# Patient Record
Sex: Female | Born: 1955 | Race: White | Hispanic: No | Marital: Single | Smoking: Never smoker
Health system: Southern US, Community
[De-identification: ages and names within clinical notes are randomized; demographics above are authoritative.]

## PROBLEM LIST (undated history)

## (undated) DIAGNOSIS — Z923 Personal history of irradiation: Secondary | ICD-10-CM

## (undated) DIAGNOSIS — Z853 Personal history of malignant neoplasm of breast: Secondary | ICD-10-CM

## (undated) DIAGNOSIS — Z9221 Personal history of antineoplastic chemotherapy: Secondary | ICD-10-CM

## (undated) DIAGNOSIS — C50919 Malignant neoplasm of unspecified site of unspecified female breast: Secondary | ICD-10-CM

## (undated) HISTORY — DX: Personal history of malignant neoplasm of breast: Z85.3

## (undated) HISTORY — PX: COLONOSCOPY: SHX174

## (undated) HISTORY — DX: Malignant neoplasm of unspecified site of unspecified female breast: C50.919

## (undated) HISTORY — PX: BREAST EXCISIONAL BIOPSY: SUR124

---

## 1995-05-18 DIAGNOSIS — C50919 Malignant neoplasm of unspecified site of unspecified female breast: Secondary | ICD-10-CM

## 1995-05-18 DIAGNOSIS — Z853 Personal history of malignant neoplasm of breast: Secondary | ICD-10-CM

## 1995-05-18 HISTORY — DX: Personal history of malignant neoplasm of breast: Z85.3

## 1995-05-18 HISTORY — PX: BREAST LUMPECTOMY: SHX2

## 1995-05-18 HISTORY — DX: Malignant neoplasm of unspecified site of unspecified female breast: C50.919

## 1998-06-19 ENCOUNTER — Other Ambulatory Visit: Admission: RE | Admit: 1998-06-19 | Discharge: 1998-06-19 | Payer: Self-pay | Admitting: Obstetrics and Gynecology

## 1999-04-30 ENCOUNTER — Encounter: Admission: RE | Admit: 1999-04-30 | Discharge: 1999-06-30 | Payer: Self-pay | Admitting: Hematology & Oncology

## 2000-02-04 ENCOUNTER — Other Ambulatory Visit: Admission: RE | Admit: 2000-02-04 | Discharge: 2000-02-04 | Payer: Self-pay | Admitting: Obstetrics and Gynecology

## 2000-07-19 ENCOUNTER — Other Ambulatory Visit: Admission: RE | Admit: 2000-07-19 | Discharge: 2000-07-19 | Payer: Self-pay | Admitting: Obstetrics and Gynecology

## 2000-07-19 ENCOUNTER — Encounter (INDEPENDENT_AMBULATORY_CARE_PROVIDER_SITE_OTHER): Payer: Self-pay | Admitting: Specialist

## 2000-08-30 ENCOUNTER — Encounter: Payer: Self-pay | Admitting: Obstetrics and Gynecology

## 2000-08-30 ENCOUNTER — Encounter: Admission: RE | Admit: 2000-08-30 | Discharge: 2000-08-30 | Payer: Self-pay | Admitting: Internal Medicine

## 2001-08-31 ENCOUNTER — Encounter: Payer: Self-pay | Admitting: Obstetrics and Gynecology

## 2001-08-31 ENCOUNTER — Encounter: Admission: RE | Admit: 2001-08-31 | Discharge: 2001-08-31 | Payer: Self-pay | Admitting: Obstetrics and Gynecology

## 2003-01-08 ENCOUNTER — Encounter: Payer: Self-pay | Admitting: Obstetrics and Gynecology

## 2003-01-08 ENCOUNTER — Encounter: Admission: RE | Admit: 2003-01-08 | Discharge: 2003-01-08 | Payer: Self-pay | Admitting: Obstetrics and Gynecology

## 2004-09-29 ENCOUNTER — Encounter: Admission: RE | Admit: 2004-09-29 | Discharge: 2004-09-29 | Payer: Self-pay | Admitting: Obstetrics and Gynecology

## 2005-10-19 ENCOUNTER — Encounter: Admission: RE | Admit: 2005-10-19 | Discharge: 2005-10-19 | Payer: Self-pay | Admitting: Obstetrics and Gynecology

## 2007-03-08 ENCOUNTER — Encounter: Admission: RE | Admit: 2007-03-08 | Discharge: 2007-03-08 | Payer: Self-pay | Admitting: Obstetrics and Gynecology

## 2010-06-07 ENCOUNTER — Encounter: Payer: Self-pay | Admitting: Obstetrics and Gynecology

## 2011-11-15 ENCOUNTER — Other Ambulatory Visit: Payer: Self-pay | Admitting: Obstetrics and Gynecology

## 2011-11-15 DIAGNOSIS — Z1231 Encounter for screening mammogram for malignant neoplasm of breast: Secondary | ICD-10-CM

## 2011-11-26 ENCOUNTER — Ambulatory Visit
Admission: RE | Admit: 2011-11-26 | Discharge: 2011-11-26 | Disposition: A | Discharge status: Home / Self Care | Payer: BC Managed Care – PPO | Source: Ambulatory Visit | Attending: Obstetrics and Gynecology | Admitting: Obstetrics and Gynecology

## 2011-11-26 DIAGNOSIS — Z1231 Encounter for screening mammogram for malignant neoplasm of breast: Secondary | ICD-10-CM

## 2012-08-11 ENCOUNTER — Encounter: Payer: Self-pay | Admitting: Internal Medicine

## 2012-09-26 ENCOUNTER — Ambulatory Visit (AMBULATORY_SURGERY_CENTER): Payer: BC Managed Care – PPO | Admitting: *Deleted

## 2012-09-26 ENCOUNTER — Encounter: Payer: Self-pay | Admitting: Internal Medicine

## 2012-09-26 VITALS — Ht 67.0 in | Wt 127.6 lb

## 2012-09-26 DIAGNOSIS — Z1211 Encounter for screening for malignant neoplasm of colon: Secondary | ICD-10-CM

## 2012-09-26 MED ORDER — MOVIPREP 100 G PO SOLR: ORAL | Status: DC

## 2012-10-10 ENCOUNTER — Ambulatory Visit (AMBULATORY_SURGERY_CENTER): Payer: BC Managed Care – PPO | Admitting: Internal Medicine

## 2012-10-10 ENCOUNTER — Encounter: Payer: Self-pay | Admitting: Internal Medicine

## 2012-10-10 VITALS — BP 150/80 | HR 68 | Temp 98.6°F | Resp 21 | Ht 67.0 in | Wt 127.0 lb

## 2012-10-10 DIAGNOSIS — D126 Benign neoplasm of colon, unspecified: Secondary | ICD-10-CM

## 2012-10-10 DIAGNOSIS — Z1211 Encounter for screening for malignant neoplasm of colon: Secondary | ICD-10-CM

## 2012-10-10 MED ORDER — SODIUM CHLORIDE 0.9 % IV SOLN: 500.0000 mL | INTRAVENOUS | Status: DC

## 2012-10-10 NOTE — Progress Notes (Signed)
Patient did not experience any of the following events: a burn prior to discharge; a fall within the facility; wrong site/side/patient/procedure/implant event; or a hospital transfer or hospital admission upon discharge from the facility. (G8907) Patient did not have preoperative order for IV antibiotic SSI prophylaxis. (G8918)  

## 2012-10-10 NOTE — Progress Notes (Signed)
No egg or soy allergy. ewm 

## 2012-10-10 NOTE — Patient Instructions (Addendum)

## 2012-10-10 NOTE — Op Note (Signed)
[facility] [address] [place], [address]   COLONOSCOPY PROCEDURE REPORT  PATIENT: Kayla, Ortega  MR#: [id number] BIRTHDATE: 1956/04/18 , 57  yrs. old GENDER: Female ENDOSCOPIST: Roxy Cedar, MD REFERRED NG:EXBMWU Ambrose Mantle, M.D. PROCEDURE DATE:  10/10/2012 PROCEDURE:   Colonoscopy with snare polypectomy    x 1 ASA CLASS:   Class II INDICATIONS:average risk screening. MEDICATIONS: MAC sedation, administered by CRNA and propofol (Diprivan) 300mg  IV  DESCRIPTION OF PROCEDURE:   After the risks benefits and alternatives of the procedure were thoroughly explained, informed consent was obtained.  A digital rectal exam revealed no abnormalities of the rectum.   The LB [id number]  endoscope was introduced through the anus and advanced to the cecum, which was identified by both the appendix and ileocecal valve. No adverse events experienced.   The quality of the prep was excellent, using MoviPrep  The instrument was then slowly withdrawn as the colon was fully examined.      COLON FINDINGS: A diminutive polyp was found in the descending colon.  A polypectomy was performed with a cold snare.  The resection was complete and the polyp tissue was completely retrieved.   The colon was otherwise normal.Retroflexion in the right colon was performed.  There was no diverticulosis, inflammation, other polyps or cancers .  Retroflexed views revealed no abnormalities. The time to cecum=4 minutes 26 seconds. Withdrawal time=11 minutes 20 seconds.  The scope was withdrawn and the procedure completed. COMPLICATIONS: There were no complications.  ENDOSCOPIC IMPRESSION: 1.   Diminutive polyp was found in the descending colon; polypectomy was performed with a cold snare 2.   The colon was otherwise normal  RECOMMENDATIONS: 1. Repeat colonoscopy in 5 years if polyp adenomatous; otherwise 10 years   eSigned:  Roxy Cedar, MD 10/10/2012 8:44 AM   cc:  Tracey Harries, MD and The Patient   PATIENT NAME:  Kayla, Ortega MR#: [id number]

## 2012-10-10 NOTE — Progress Notes (Signed)
Called to room to assist during endoscopic procedure.  Patient ID and intended procedure confirmed with present staff. Received instructions for my participation in the procedure from the performing physician.  

## 2012-10-11 ENCOUNTER — Telehealth: Payer: Self-pay

## 2012-10-11 NOTE — Telephone Encounter (Signed)
At patient request do not leave message. 

## 2012-10-16 ENCOUNTER — Encounter: Payer: Self-pay | Admitting: Internal Medicine

## 2013-07-16 ENCOUNTER — Other Ambulatory Visit: Payer: Self-pay

## 2013-07-16 DIAGNOSIS — Z1231 Encounter for screening mammogram for malignant neoplasm of breast: Secondary | ICD-10-CM

## 2013-07-25 ENCOUNTER — Ambulatory Visit
Admission: RE | Admit: 2013-07-25 | Discharge: 2013-07-25 | Disposition: A | Discharge status: Home / Self Care | Payer: BC Managed Care – PPO | Source: Ambulatory Visit

## 2013-07-25 DIAGNOSIS — Z1231 Encounter for screening mammogram for malignant neoplasm of breast: Secondary | ICD-10-CM

## 2013-10-11 LAB — HM PAP SMEAR: HM PAP: NEGATIVE

## 2014-08-19 ENCOUNTER — Other Ambulatory Visit: Payer: Self-pay

## 2014-08-19 DIAGNOSIS — Z1231 Encounter for screening mammogram for malignant neoplasm of breast: Secondary | ICD-10-CM

## 2014-08-29 ENCOUNTER — Ambulatory Visit
Admission: RE | Admit: 2014-08-29 | Discharge: 2014-08-29 | Disposition: A | Discharge status: Home / Self Care | Payer: BLUE CROSS/BLUE SHIELD | Source: Ambulatory Visit

## 2014-08-29 DIAGNOSIS — Z1231 Encounter for screening mammogram for malignant neoplasm of breast: Secondary | ICD-10-CM

## 2015-06-18 ENCOUNTER — Other Ambulatory Visit: Payer: Self-pay

## 2015-06-18 DIAGNOSIS — Z1231 Encounter for screening mammogram for malignant neoplasm of breast: Secondary | ICD-10-CM

## 2015-08-07 ENCOUNTER — Ambulatory Visit (INDEPENDENT_AMBULATORY_CARE_PROVIDER_SITE_OTHER): Payer: BLUE CROSS/BLUE SHIELD | Admitting: Internal Medicine

## 2015-08-07 ENCOUNTER — Encounter: Payer: Self-pay | Admitting: Internal Medicine

## 2015-08-07 VITALS — BP 118/70 | HR 70 | Temp 98.7°F | Ht 64.5 in | Wt 134.0 lb

## 2015-08-07 DIAGNOSIS — K5900 Constipation, unspecified: Secondary | ICD-10-CM | POA: Diagnosis not present

## 2015-08-07 DIAGNOSIS — Z0000 Encounter for general adult medical examination without abnormal findings: Secondary | ICD-10-CM | POA: Diagnosis not present

## 2015-08-07 DIAGNOSIS — E2839 Other primary ovarian failure: Secondary | ICD-10-CM

## 2015-08-07 NOTE — Progress Notes (Signed)
Pre visit review using our clinic review tool, if applicable. No additional management support is needed unless otherwise documented below in the visit note. 

## 2015-08-07 NOTE — Progress Notes (Signed)
HPI  Pt presents to the clinic today to establish care. She has not had a PCP in many years but does follow with a GYN.  Flu: never Tetanus: unsure Pap Smear: 08/2014 at [facility]: 08/2014 Colon Screening: 2014, every 5 years Vision Screening: as needed Dentist: biannually  Diet: She does eat meat. She eats fruits and veggies daily. She does consume some fried foods. She drinks coffee during the day. Exercise: She does a lot of walking at work, none outside of work.  Past Medical History  Diagnosis Date  . Breast cancer ([place]) 1997    right breast lumpectomy  . History of breast cancer 1997    left    No current outpatient prescriptions on file.   No current facility-administered medications for this visit.    No Known Allergies  Family History  Problem Relation Age of Onset  . Colon cancer Neg Hx   . Alzheimer's disease Father     Social History   Social History  . Marital Status: Single    Spouse Name: N/A  . Number of Children: N/A  . Years of Education: N/A   Occupational History  . Not on file.   Social History Main Topics  . Smoking status: Never Smoker   . Smokeless tobacco: Never Used  . Alcohol Use: 4.2 oz/week    7 Glasses of wine per week  . Drug Use: No  . Sexual Activity: Not on file   Other Topics Concern  . Not on file   Social History Narrative    ROS:  Constitutional: Denies fever, malaise, fatigue, headache or abrupt weight changes.  HEENT: Pt reports decreased hearing (wears hearing aids) Denies eye pain, eye redness, ear pain, ringing in the ears, wax buildup, runny nose, nasal congestion, bloody nose, or sore throat. Respiratory: Denies difficulty breathing, shortness of breath, cough or sputum production.   Cardiovascular: Denies chest pain, chest tightness, palpitations or swelling in the hands or feet.  Gastrointestinal: Pt reports constipation. Denies abdominal pain, bloating, diarrhea or blood in the stool.  GU:  Denies frequency, urgency, pain with urination, blood in urine, odor or discharge. Musculoskeletal: Denies decrease in range of motion, difficulty with gait, muscle pain or joint pain and swelling.  Skin: Denies redness, rashes, lesions or ulcercations.  Neurological: Denies dizziness, difficulty with memory, difficulty with speech or problems with balance and coordination.  Psych: Denies anxiety, depression, SI/HI.  No other specific complaints in a complete review of systems (except as listed in HPI above).  PE:  BP 118/70 mmHg  Pulse 70  Temp(Src) 98.7 F (37.1 C) (Oral)  Ht 5' 4.5" (1.638 m)  Wt 134 lb (60.782 kg)  BMI 22.65 kg/m2  SpO2 98% Wt Readings from Last 3 Encounters:  08/07/15 134 lb (60.782 kg)  10/10/12 127 lb (57.607 kg)  09/26/12 127 lb 9.6 oz (57.879 kg)    General: Appears her stated age, well developed, well nourished in NAD. HEENT: Head: normal shape and size; Eyes: sclera white, no icterus, conjunctiva pink, PERRLA and EOMs intact; Ears: Tm's gray and intact, normal light reflex;mThroat/Mouth: Teeth present, mucosa pink and moist, no lesions or ulcerations noted.  Neck: Neck supple, trachea midline. No masses, lumps or thyromegaly present.  Cardiovascular: Normal rate and rhythm. S1,S2 noted.  No murmur, rubs or gallops noted. No JVD or BLE edema. No carotid bruits noted. Pulmonary/Chest: Normal effort and positive vesicular breath sounds. No respiratory distress. No wheezes, rales or ronchi noted.  Abdomen: Soft  and nontender. Normal bowel sounds. No distention or masses noted. Liver, spleen and kidneys non palpable. Musculoskeletal: Strength 5/5 BUE/BLE. No signs of joint swelling. No difficulty with gait.  Neurological: Alert and oriented. Cranial nerves II-XII grossly intact. Coordination normal.  Psychiatric: Mood and affect normal. Behavior is normal. Judgment and thought content normal.     Assessment and Plan:  Preventative Health  Maintenance:  She declines flu and tetanus today She has her mammogram scheduled 08/2015 Pap smear UTD Bone density (request per GYN) ordered, someone will call you to schedule Encouraged her to see an eye doctor and dentist at least annually Encouraged her to consume a balanced diet and exercise regimen She declines screening labs today  Constipation:  Try Align OTC Increase your water intake  RTC in 1 year, sooner if needed

## 2015-08-07 NOTE — Patient Instructions (Signed)

## 2015-08-18 ENCOUNTER — Encounter: Payer: Self-pay | Admitting: Internal Medicine

## 2015-09-03 ENCOUNTER — Ambulatory Visit
Admission: RE | Admit: 2015-09-03 | Discharge: 2015-09-03 | Disposition: A | Discharge status: Home / Self Care | Payer: BLUE CROSS/BLUE SHIELD | Source: Ambulatory Visit

## 2015-09-03 ENCOUNTER — Ambulatory Visit
Admission: RE | Admit: 2015-09-03 | Discharge: 2015-09-03 | Disposition: A | Discharge status: Home / Self Care | Payer: BLUE CROSS/BLUE SHIELD | Source: Ambulatory Visit | Attending: Internal Medicine | Admitting: Internal Medicine

## 2015-09-03 ENCOUNTER — Ambulatory Visit: Payer: BLUE CROSS/BLUE SHIELD

## 2015-09-03 DIAGNOSIS — Z1231 Encounter for screening mammogram for malignant neoplasm of breast: Secondary | ICD-10-CM

## 2015-09-03 DIAGNOSIS — E2839 Other primary ovarian failure: Secondary | ICD-10-CM

## 2015-10-07 ENCOUNTER — Encounter: Payer: Self-pay | Admitting: Internal Medicine

## 2016-01-23 ENCOUNTER — Ambulatory Visit (INDEPENDENT_AMBULATORY_CARE_PROVIDER_SITE_OTHER): Payer: BLUE CROSS/BLUE SHIELD | Admitting: Family Medicine

## 2016-01-23 VITALS — BP 108/68 | HR 85 | Temp 98.2°F

## 2016-01-23 DIAGNOSIS — T148 Other injury of unspecified body region: Secondary | ICD-10-CM | POA: Diagnosis not present

## 2016-01-23 DIAGNOSIS — W540XXA Bitten by dog, initial encounter: Secondary | ICD-10-CM | POA: Diagnosis not present

## 2016-01-23 DIAGNOSIS — Z23 Encounter for immunization: Secondary | ICD-10-CM

## 2016-01-23 MED ORDER — AMOXICILLIN-POT CLAVULANATE 875-125 MG PO TABS: 1.0000 | ORAL_TABLET | Freq: Two times a day (BID) | ORAL | 0 refills | Status: DC

## 2016-01-23 NOTE — Patient Instructions (Signed)
Tetanus shot today.  Start augmentin.  Monitor and quarantine the dog.  If acting abnormally, contact animal control and go to ER.  Keep clean and covered with nonstick bandage.  Elevate as much as possible.  Take care.  Glad to see you.  Update [place] as needed.

## 2016-01-23 NOTE — Progress Notes (Signed)
Bitten by dog just prior to arrival. Unknown tetanus date. She was bitten on the left lower leg. The dog is 34 week old dog, not yet vaccinated for rabies.  Pure bred, not a high risk for rabies.  No known rabies exposure. Nothing done to the wound by patient.  Meds, vitals, and allergies reviewed.   ROS: Per HPI unless specifically indicated in ROS section   nad rrr ctabL lower leg, would is ~3cm in total, V shaped.  The wound was thoroughly irrigated with saline diluted iodine.  No foreign body. Very minimal oozing, controlled with pressure. No bleeding thereafter. The tip of the "V" of tissue was still intact and attached.

## 2016-01-25 ENCOUNTER — Encounter: Payer: Self-pay | Admitting: Family Medicine

## 2016-01-25 DIAGNOSIS — W540XXA Bitten by dog, initial encounter: Secondary | ICD-10-CM | POA: Insufficient documentation

## 2016-01-25 NOTE — Assessment & Plan Note (Addendum)
There was no reason to suture the wound. Discussed with patient. Neosporin and nonstick bandage applied with roll gauze over top of that.  Routine rabies precautions discussed with patient. They can quarantine and monitor the dog at home. If the dog begins acting abnormally in any way, she should go to the emergency room. We do not have the vaccine for rabies here. It would be a low risk exposure since the dog was a pure bred dog and is young.  Tetanus updated.  The wound was thoroughly irrigated. It does not appear overtly infected, but I will still start Augmentin. Rx sent.  The dog has not bitten anyone else. Discussed with patient about routine safety.

## 2016-08-10 ENCOUNTER — Other Ambulatory Visit: Payer: Self-pay | Admitting: Obstetrics and Gynecology

## 2016-08-10 DIAGNOSIS — Z1231 Encounter for screening mammogram for malignant neoplasm of breast: Secondary | ICD-10-CM

## 2016-08-31 ENCOUNTER — Ambulatory Visit
Admission: RE | Admit: 2016-08-31 | Discharge: 2016-08-31 | Disposition: A | Discharge status: Home / Self Care | Payer: BLUE CROSS/BLUE SHIELD | Source: Ambulatory Visit | Attending: Obstetrics and Gynecology | Admitting: Obstetrics and Gynecology

## 2016-08-31 DIAGNOSIS — Z1231 Encounter for screening mammogram for malignant neoplasm of breast: Secondary | ICD-10-CM

## 2016-08-31 HISTORY — DX: Personal history of antineoplastic chemotherapy: Z92.21

## 2016-08-31 HISTORY — DX: Personal history of irradiation: Z92.3

## 2017-04-08 ENCOUNTER — Ambulatory Visit: Payer: Self-pay | Admitting: *Deleted

## 2017-04-08 NOTE — Telephone Encounter (Signed)
   Reason for Disposition . Difficulty breathing  Answer Assessment - Initial Assessment Questions 1. ONSET: "When did the cough begin?"      2-3 weeks 2. SEVERITY: "How bad is the cough today?"      Coughs every time she talks 3. RESPIRATORY DISTRESS: "Describe your breathing."      Patient feels that her heart rate is up- she coughs when she breaths deeply 4. FEVER: "Do you have a fever?" If so, ask: "What is your temperature, how was it measured, and when did it start?"     no 5. HEMOPTYSIS: "Are you coughing up any blood?" If so ask: "How much?" (flecks, streaks, tablespoons, etc.)     Not coughing up anything 6. TREATMENT: "What have you done so far to treat the cough?" (e.g., meds, fluids, humidifier)     nothing 7. CARDIAC HISTORY: "Do you have any history of heart disease?" (e.g., heart attack, congestive heart failure)      no 8. LUNG HISTORY: "Do you have any history of lung disease?"  (e.g., pulmonary embolus, asthma, emphysema)     no 9. PE RISK FACTORS: "Do you have a history of blood clots?" (or: recent major surgery, recent prolonged travel, bedridden )     no 10. OTHER SYMPTOMS: "Do you have any other symptoms? (e.g., runny nose, wheezing, chest pain)       No pain- just short of breath 11. PREGNANCY: "Is there any chance you are pregnant?" "When was your last menstrual period?"       n/a 12. TRAVEL: "Have you traveled out of the country in the last month?" (e.g., travel history, exposures)       no  Protocols used: COUGH - ACUTE NON-PRODUCTIVE-A-AH

## 2017-04-22 ENCOUNTER — Ambulatory Visit: Payer: BLUE CROSS/BLUE SHIELD | Admitting: Family Medicine

## 2017-04-26 ENCOUNTER — Ambulatory Visit: Payer: BLUE CROSS/BLUE SHIELD | Admitting: Family Medicine

## 2017-04-28 ENCOUNTER — Ambulatory Visit (INDEPENDENT_AMBULATORY_CARE_PROVIDER_SITE_OTHER)
Admission: RE | Admit: 2017-04-28 | Discharge: 2017-04-28 | Disposition: A | Discharge status: Home / Self Care | Payer: BLUE CROSS/BLUE SHIELD | Source: Ambulatory Visit | Attending: Family Medicine | Admitting: Family Medicine

## 2017-04-28 ENCOUNTER — Encounter: Payer: Self-pay | Admitting: Family Medicine

## 2017-04-28 ENCOUNTER — Telehealth: Payer: Self-pay | Admitting: Internal Medicine

## 2017-04-28 ENCOUNTER — Ambulatory Visit: Payer: BLUE CROSS/BLUE SHIELD | Admitting: Family Medicine

## 2017-04-28 VITALS — BP 110/70 | HR 88 | Temp 98.1°F | Wt 138.0 lb

## 2017-04-28 DIAGNOSIS — R05 Cough: Secondary | ICD-10-CM | POA: Diagnosis not present

## 2017-04-28 DIAGNOSIS — R059 Cough, unspecified: Secondary | ICD-10-CM

## 2017-04-28 MED ORDER — ALBUTEROL SULFATE HFA 108 (90 BASE) MCG/ACT IN AERS: 2.0000 | INHALATION_SPRAY | Freq: Four times a day (QID) | RESPIRATORY_TRACT | 0 refills | Status: DC | PRN

## 2017-04-28 MED ORDER — DOXYCYCLINE HYCLATE 100 MG PO TABS: 100.0000 mg | ORAL_TABLET | Freq: Two times a day (BID) | ORAL | 0 refills | Status: DC

## 2017-04-28 NOTE — Telephone Encounter (Signed)
Copied from [place]. Topic: Quick Communication - See Telephone Encounter >> Apr 28, 2017  4:26 PM Cleaster Corin, NT wrote: CRM for notification. See Telephone encounter for:   04/28/17. Pt calling about lab results please call pt. With results when ready.

## 2017-04-28 NOTE — Patient Instructions (Signed)
Go to the lab on the way out.  We'll contact you with your xray report. We'll make plans about the cough when I see your xray.  Take care.  Glad to see you.

## 2017-04-28 NOTE — Progress Notes (Signed)
Cough started abut 4 weeks ago.  Not better in the meantime, maybe getting worse.  No fevers but had some chills.  She has some cough triggered by talking.  Fatigued.  Not sleeping well from the cough.  No sputum.  Nonsmoker.  No h/o asthma.  No wheeze.  No ST, no head congestion.  No heartburn.   Meds, vitals, and allergies reviewed.   ROS: Per HPI unless specifically indicated in ROS section   GEN: nad, alert and oriented HEENT: mucous membranes moist, TM wnl, nasal exam slightly stuffy, OP wnl NECK: supple w/o LA CV: rrr.  no murmur PULM: ctab, no inc wob, no wheeze.  occ cough noted.  ABD: soft, +bs EXT: no edema

## 2017-04-28 NOTE — Telephone Encounter (Signed)
Pt has not had any labs

## 2017-04-29 DIAGNOSIS — R05 Cough: Secondary | ICD-10-CM | POA: Insufficient documentation

## 2017-04-29 DIAGNOSIS — R059 Cough, unspecified: Secondary | ICD-10-CM | POA: Insufficient documentation

## 2017-04-29 NOTE — Assessment & Plan Note (Signed)
She has some scarring noted on the CXR- reviewed- but no pneumonia seen. She has changes suggestive of COPD but that can't be formally diagnosed by CXR and without a smoking hx I wouldn't suspect that. Given that she didn't have PNA see on the CXR, I think the best option is to have her start albuterol inhaler. If that helps the cough, then no further tx needed. However, if she continues to have chills or more sputum or gets worse (or not get better), then would add on doxycycline. I sent both rx to the pharmacy, would hold the doxy for now and start with the inhaler. okay for outpatient f/u.

## 2017-08-09 ENCOUNTER — Other Ambulatory Visit: Payer: Self-pay | Admitting: Obstetrics and Gynecology

## 2017-08-09 DIAGNOSIS — Z1231 Encounter for screening mammogram for malignant neoplasm of breast: Secondary | ICD-10-CM

## 2017-09-06 ENCOUNTER — Ambulatory Visit: Payer: BLUE CROSS/BLUE SHIELD

## 2017-09-21 ENCOUNTER — Encounter: Payer: Self-pay | Admitting: Internal Medicine

## 2017-09-27 ENCOUNTER — Ambulatory Visit
Admission: RE | Admit: 2017-09-27 | Discharge: 2017-09-27 | Disposition: A | Discharge status: Home / Self Care | Payer: BLUE CROSS/BLUE SHIELD | Source: Ambulatory Visit | Attending: Obstetrics and Gynecology | Admitting: Obstetrics and Gynecology

## 2017-09-27 DIAGNOSIS — Z1231 Encounter for screening mammogram for malignant neoplasm of breast: Secondary | ICD-10-CM

## 2018-12-19 ENCOUNTER — Other Ambulatory Visit: Payer: Self-pay | Admitting: Obstetrics and Gynecology

## 2018-12-19 DIAGNOSIS — Z1231 Encounter for screening mammogram for malignant neoplasm of breast: Secondary | ICD-10-CM

## 2018-12-29 ENCOUNTER — Ambulatory Visit: Payer: BLUE CROSS/BLUE SHIELD

## 2019-08-23 ENCOUNTER — Ambulatory Visit: Payer: Self-pay | Attending: Internal Medicine

## 2019-08-23 DIAGNOSIS — Z23 Encounter for immunization: Secondary | ICD-10-CM

## 2019-08-23 NOTE — Progress Notes (Signed)
   Covid-19 Vaccination Clinic  Name:  Kayla Ortega    MRN: [id number] DOB: 1955-06-28  08/23/2019  Ms. [name] was observed post Covid-19 immunization for 15 minutes without incident. She was provided with Vaccine Information Sheet and instruction to access the V-Safe system.   Ms. [name] was instructed to call 911 with any severe reactions post vaccine: Marland Kitchen Difficulty breathing  . Swelling of face and throat  . A fast heartbeat  . A bad rash all over body  . Dizziness and weakness   Immunizations Administered    Name Date Dose VIS Date Route   Pfizer COVID-19 Vaccine 08/23/2019  9:06 AM 0.3 mL 04/27/2019 Intramuscular   Manufacturer: [employer], [employer]   Lot: [id number]   [place]: [id number]

## 2019-09-17 ENCOUNTER — Ambulatory Visit: Payer: Self-pay | Attending: Internal Medicine

## 2019-09-17 DIAGNOSIS — Z23 Encounter for immunization: Secondary | ICD-10-CM

## 2019-09-17 NOTE — Progress Notes (Signed)
   Covid-19 Vaccination Clinic  Name:  Kayla Ortega    MRN: [id number] DOB: 03-15-1956  09/17/2019  [name] was observed post Covid-19 immunization for 15 minutes without incident. She was provided with Vaccine Information Sheet and instruction to access the V-Safe system.   [name] was instructed to call 911 with any severe reactions post vaccine: Marland Kitchen Difficulty breathing  . Swelling of face and throat  . A fast heartbeat  . A bad rash all over body  . Dizziness and weakness   Immunizations Administered    Name Date Dose VIS Date Route   Pfizer COVID-19 Vaccine 09/17/2019  8:28 AM 0.3 mL 07/11/2018 Intramuscular   Manufacturer: [place]   Lot: [id number]   [place]: [id number]

## 2019-11-22 ENCOUNTER — Other Ambulatory Visit: Payer: Self-pay | Admitting: Internal Medicine

## 2019-11-22 DIAGNOSIS — Z1231 Encounter for screening mammogram for malignant neoplasm of breast: Secondary | ICD-10-CM

## 2019-12-03 ENCOUNTER — Other Ambulatory Visit: Payer: Self-pay

## 2019-12-03 ENCOUNTER — Ambulatory Visit
Admission: RE | Admit: 2019-12-03 | Discharge: 2019-12-03 | Disposition: A | Discharge status: Home / Self Care | Payer: 59 | Source: Ambulatory Visit | Attending: Internal Medicine | Admitting: Internal Medicine

## 2019-12-03 DIAGNOSIS — Z1231 Encounter for screening mammogram for malignant neoplasm of breast: Secondary | ICD-10-CM

## 2021-01-13 ENCOUNTER — Other Ambulatory Visit: Payer: Self-pay | Admitting: Obstetrics and Gynecology

## 2021-01-13 DIAGNOSIS — Z1231 Encounter for screening mammogram for malignant neoplasm of breast: Secondary | ICD-10-CM

## 2021-02-23 ENCOUNTER — Ambulatory Visit
Admission: RE | Admit: 2021-02-23 | Discharge: 2021-02-23 | Disposition: A | Discharge status: Home / Self Care | Payer: 59 | Source: Ambulatory Visit | Attending: Obstetrics and Gynecology | Admitting: Obstetrics and Gynecology

## 2021-02-23 ENCOUNTER — Other Ambulatory Visit: Payer: Self-pay

## 2021-02-23 DIAGNOSIS — Z1231 Encounter for screening mammogram for malignant neoplasm of breast: Secondary | ICD-10-CM

## 2021-09-14 DIAGNOSIS — R5383 Other fatigue: Secondary | ICD-10-CM | POA: Diagnosis not present

## 2021-09-21 DIAGNOSIS — R5383 Other fatigue: Secondary | ICD-10-CM | POA: Diagnosis not present

## 2021-09-21 DIAGNOSIS — E559 Vitamin D deficiency, unspecified: Secondary | ICD-10-CM | POA: Diagnosis not present

## 2021-10-05 DIAGNOSIS — R5383 Other fatigue: Secondary | ICD-10-CM | POA: Diagnosis not present

## 2021-10-05 DIAGNOSIS — R010 Benign and innocent cardiac murmurs: Secondary | ICD-10-CM | POA: Diagnosis not present

## 2021-10-12 DIAGNOSIS — E782 Mixed hyperlipidemia: Secondary | ICD-10-CM | POA: Diagnosis not present

## 2021-10-12 DIAGNOSIS — E559 Vitamin D deficiency, unspecified: Secondary | ICD-10-CM | POA: Diagnosis not present

## 2021-10-19 DIAGNOSIS — R9431 Abnormal electrocardiogram [ECG] [EKG]: Secondary | ICD-10-CM | POA: Diagnosis not present

## 2021-12-28 ENCOUNTER — Ambulatory Visit: Payer: Self-pay | Admitting: Family Medicine

## 2022-01-04 ENCOUNTER — Ambulatory Visit: Payer: Self-pay | Admitting: Family Medicine

## 2022-01-26 ENCOUNTER — Encounter: Payer: Self-pay | Admitting: Family Medicine

## 2022-01-26 ENCOUNTER — Encounter: Payer: Self-pay | Admitting: Internal Medicine

## 2022-02-01 ENCOUNTER — Ambulatory Visit: Payer: Self-pay | Admitting: Family Medicine

## 2022-03-09 DIAGNOSIS — H905 Unspecified sensorineural hearing loss: Secondary | ICD-10-CM | POA: Diagnosis not present

## 2022-03-29 DIAGNOSIS — Z853 Personal history of malignant neoplasm of breast: Secondary | ICD-10-CM | POA: Diagnosis not present

## 2022-05-06 ENCOUNTER — Ambulatory Visit: Payer: Self-pay | Admitting: Family Medicine

## 2022-06-07 ENCOUNTER — Ambulatory Visit: Payer: Self-pay | Admitting: Family Medicine

## 2022-06-17 ENCOUNTER — Other Ambulatory Visit: Payer: Self-pay | Admitting: Obstetrics and Gynecology

## 2022-06-17 DIAGNOSIS — Z1231 Encounter for screening mammogram for malignant neoplasm of breast: Secondary | ICD-10-CM

## 2022-06-18 ENCOUNTER — Ambulatory Visit
Admission: RE | Admit: 2022-06-18 | Discharge: 2022-06-18 | Disposition: A | Discharge status: Home / Self Care | Payer: Medicare HMO | Source: Ambulatory Visit | Attending: Obstetrics and Gynecology | Admitting: Obstetrics and Gynecology

## 2022-06-18 DIAGNOSIS — Z1231 Encounter for screening mammogram for malignant neoplasm of breast: Secondary | ICD-10-CM

## 2022-06-22 ENCOUNTER — Other Ambulatory Visit: Payer: Self-pay | Admitting: Obstetrics and Gynecology

## 2022-06-22 DIAGNOSIS — R928 Other abnormal and inconclusive findings on diagnostic imaging of breast: Secondary | ICD-10-CM

## 2022-07-05 ENCOUNTER — Ambulatory Visit
Admission: RE | Admit: 2022-07-05 | Discharge: 2022-07-05 | Disposition: A | Discharge status: Home / Self Care | Payer: Medicare HMO | Source: Ambulatory Visit | Attending: Obstetrics and Gynecology | Admitting: Obstetrics and Gynecology

## 2022-07-05 ENCOUNTER — Ambulatory Visit: Payer: Medicare HMO

## 2022-07-05 DIAGNOSIS — Z853 Personal history of malignant neoplasm of breast: Secondary | ICD-10-CM | POA: Diagnosis not present

## 2022-07-05 DIAGNOSIS — R928 Other abnormal and inconclusive findings on diagnostic imaging of breast: Secondary | ICD-10-CM

## 2022-10-05 ENCOUNTER — Ambulatory Visit: Payer: Self-pay | Admitting: Family Medicine

## 2022-10-25 ENCOUNTER — Ambulatory Visit: Payer: Self-pay | Admitting: Family Medicine

## 2022-12-08 ENCOUNTER — Encounter: Payer: Self-pay | Admitting: Family Medicine

## 2022-12-08 ENCOUNTER — Ambulatory Visit (INDEPENDENT_AMBULATORY_CARE_PROVIDER_SITE_OTHER): Payer: Medicare HMO | Admitting: Family Medicine

## 2022-12-08 VITALS — BP 114/68 | HR 77 | Temp 98.7°F | Ht 64.5 in | Wt 137.6 lb

## 2022-12-08 DIAGNOSIS — E559 Vitamin D deficiency, unspecified: Secondary | ICD-10-CM | POA: Diagnosis not present

## 2022-12-08 DIAGNOSIS — R7309 Other abnormal glucose: Secondary | ICD-10-CM | POA: Diagnosis not present

## 2022-12-08 DIAGNOSIS — Z1159 Encounter for screening for other viral diseases: Secondary | ICD-10-CM

## 2022-12-08 DIAGNOSIS — E538 Deficiency of other specified B group vitamins: Secondary | ICD-10-CM

## 2022-12-08 DIAGNOSIS — K5900 Constipation, unspecified: Secondary | ICD-10-CM | POA: Diagnosis not present

## 2022-12-08 DIAGNOSIS — Z114 Encounter for screening for human immunodeficiency virus [HIV]: Secondary | ICD-10-CM

## 2022-12-08 DIAGNOSIS — Z1211 Encounter for screening for malignant neoplasm of colon: Secondary | ICD-10-CM

## 2022-12-08 DIAGNOSIS — Z13220 Encounter for screening for lipoid disorders: Secondary | ICD-10-CM

## 2022-12-08 MED ORDER — POLYETHYLENE GLYCOL 3350 17 GM/SCOOP PO POWD: 17.0000 g | Freq: Every day | ORAL | 1 refills | Status: DC | PRN

## 2022-12-08 MED ORDER — POLYETHYLENE GLYCOL 3350 17 GM/SCOOP PO POWD: 17.0000 g | Freq: Two times a day (BID) | ORAL | 1 refills | Status: DC | PRN

## 2022-12-08 NOTE — Patient Instructions (Addendum)
It was a pleasure meeting you today. Thank you for allowing me to take part in your health care.  Our goals for today as we discussed include:  Take Miralax 1 scoop daily until  normal bowel movement   Then as needed Increase water and soluble fibre daily   We will get some labs today.  If they are abnormal or we need to do something about them, I will call you.  If they are normal, I will send you a message on MyChart (if it is active) or a letter in the mail.  If you don't hear from [place] in 2 weeks, please call the office at the number below.   Referral sent for colonoscopy  Recommend Shingles vaccine.  This is a 2 dose series and can be given at your local pharmacy.  Please talk to your pharmacist about this.   Recommend Tetanus Vaccination.  This is given every 10 years.   Recommend Pneumonia 20 vaccine  Schedule Medicare Annual Wellness Visit   Follow up as needed  If you have any questions or concerns, please do not hesitate to call the office at [phone].  I look forward to our next visit and until then take care and stay safe.  Regards,   Dana Allan, MD   [facility]

## 2022-12-08 NOTE — Progress Notes (Signed)
SUBJECTIVE:   Chief Complaint  Patient presents with   Establish Care    Was seeing Dr. Isaias Sakai. Pt states she wanted to mention her stools been little pellets lately. Also wanted to discuss colonoscopy    HPI Patient presents to clinic to establish care  No acute concerns today  She has noticed that her stool has been more firm lately.  Her diet is inconsistent and eats one meal a day. Intermittent straining with BM. Denies any abdominal pain, bloody stool or unintentional weight loss.  Last colonoscopy in 2014 and negative. No family history of colon cancer or GI problems.  Currently on multivitamins daily  PERTINENT PMH / PSH: Right breast cancer 1996, lymph node resection.  Treated with radiation and chemotherapy  C-Section x 1  Paternal Hx dementia Maternal Hx none  Works at [employer]  Denies Tobacco use EtOH use- 2 glasses wine night  Denies Vaping, THC/other recreational drug use   OBJECTIVE:  BP 114/68 (BP Location: Left Arm, Patient Position: Sitting, Cuff Size: Normal)   Pulse 77   Temp 98.7 F (37.1 C) (Oral)   Ht 5' 4.5" (1.638 m)   Wt 137 lb 9.6 oz (62.4 kg)   SpO2 98%   BMI 23.25 kg/m    Physical Exam Vitals reviewed.  Constitutional:      General: She is not in acute distress.    Appearance: She is not ill-appearing.  HENT:     Head: Normocephalic.     Right Ear: Tympanic membrane, ear canal and external ear normal.     Left Ear: Tympanic membrane, ear canal and external ear normal.     Nose: Nose normal.     Mouth/Throat:     Mouth: Mucous membranes are moist.  Eyes:     Extraocular Movements: Extraocular movements intact.     Conjunctiva/sclera: Conjunctivae normal.     Pupils: Pupils are equal, round, and reactive to light.  Neck:     Thyroid: No thyromegaly or thyroid tenderness.     Vascular: No carotid bruit.  Cardiovascular:     Rate and Rhythm: Normal rate and regular rhythm.     Pulses: Normal pulses.     Heart sounds:  Normal heart sounds.  Pulmonary:     Effort: Pulmonary effort is normal.     Breath sounds: Normal breath sounds.  Abdominal:     General: Bowel sounds are normal. There is no distension.     Palpations: Abdomen is soft.     Tenderness: There is no abdominal tenderness. There is no right CVA tenderness, left CVA tenderness, guarding or rebound.  Musculoskeletal:        General: Normal range of motion.     Cervical back: Normal range of motion.     Right lower leg: No edema.     Left lower leg: No edema.  Lymphadenopathy:     Cervical: No cervical adenopathy.  Skin:    Capillary Refill: Capillary refill takes less than 2 seconds.  Neurological:     General: No focal deficit present.     Mental Status: She is alert and oriented to person, place, and time. Mental status is at baseline.     Motor: No weakness.  Psychiatric:        Mood and Affect: Mood normal.        Behavior: Behavior normal.        Thought Content: Thought content normal.        Judgment: Judgment normal.  12/08/2022   10:36 AM 08/07/2015   11:07 AM  Depression screen PHQ 2/9  Decreased Interest 0 0  Down, Depressed, Hopeless 0 0  PHQ - 2 Score 0 0       No data to display         ASSESSMENT/PLAN:  Constipation, unspecified constipation type Assessment & Plan: Chronic. Increase soluble fiber Increase water intake Trial Miralax 1 scoop daily as needed until regular BM.   Follow up as needed  Orders: -     Polyethylene Glycol 3350; Take 17 g by mouth daily as needed.  Dispense: 3350 g; Refill: 1 -     Comprehensive metabolic panel; Future -     CBC with Differential/Platelet; Future  Colon cancer screening -     Ambulatory referral to Gastroenterology  Encounter for screening for HIV -     HIV Antibody (routine testing w rflx); Future  Need for hepatitis C screening test -     Hepatitis C antibody; Future  Vitamin B 12 deficiency -     Vitamin B12; Future  Vitamin D deficiency -      VITAMIN D 25 Hydroxy (Vit-D Deficiency, Fractures); Future  Abnormal glucose -     Hemoglobin A1c; Future  Lipid screening -     Lipid panel; Future   PDMP reviewed  Return if symptoms worsen or fail to improve, for PCP.  Dana Allan, MD

## 2022-12-16 ENCOUNTER — Other Ambulatory Visit: Payer: Medicare HMO

## 2022-12-17 ENCOUNTER — Other Ambulatory Visit (INDEPENDENT_AMBULATORY_CARE_PROVIDER_SITE_OTHER): Payer: Medicare HMO

## 2022-12-17 DIAGNOSIS — Z114 Encounter for screening for human immunodeficiency virus [HIV]: Secondary | ICD-10-CM | POA: Diagnosis not present

## 2022-12-17 DIAGNOSIS — E538 Deficiency of other specified B group vitamins: Secondary | ICD-10-CM | POA: Diagnosis not present

## 2022-12-17 DIAGNOSIS — E559 Vitamin D deficiency, unspecified: Secondary | ICD-10-CM | POA: Diagnosis not present

## 2022-12-17 DIAGNOSIS — Z13220 Encounter for screening for lipoid disorders: Secondary | ICD-10-CM

## 2022-12-17 DIAGNOSIS — K5900 Constipation, unspecified: Secondary | ICD-10-CM

## 2022-12-17 DIAGNOSIS — Z1159 Encounter for screening for other viral diseases: Secondary | ICD-10-CM

## 2022-12-17 DIAGNOSIS — R7309 Other abnormal glucose: Secondary | ICD-10-CM

## 2022-12-20 ENCOUNTER — Encounter: Payer: Self-pay | Admitting: Family Medicine

## 2022-12-20 DIAGNOSIS — E559 Vitamin D deficiency, unspecified: Secondary | ICD-10-CM | POA: Insufficient documentation

## 2022-12-20 DIAGNOSIS — K5900 Constipation, unspecified: Secondary | ICD-10-CM | POA: Insufficient documentation

## 2022-12-20 DIAGNOSIS — Z13220 Encounter for screening for lipoid disorders: Secondary | ICD-10-CM | POA: Insufficient documentation

## 2022-12-20 DIAGNOSIS — E538 Deficiency of other specified B group vitamins: Secondary | ICD-10-CM | POA: Insufficient documentation

## 2022-12-20 DIAGNOSIS — Z114 Encounter for screening for human immunodeficiency virus [HIV]: Secondary | ICD-10-CM | POA: Insufficient documentation

## 2022-12-20 DIAGNOSIS — R7309 Other abnormal glucose: Secondary | ICD-10-CM | POA: Insufficient documentation

## 2022-12-20 DIAGNOSIS — Z1159 Encounter for screening for other viral diseases: Secondary | ICD-10-CM | POA: Insufficient documentation

## 2022-12-20 DIAGNOSIS — Z1211 Encounter for screening for malignant neoplasm of colon: Secondary | ICD-10-CM | POA: Insufficient documentation

## 2022-12-20 NOTE — Assessment & Plan Note (Addendum)
Chronic. Increase soluble fiber Increase water intake Trial Miralax 1 scoop daily as needed until regular BM.   Follow up as needed

## 2023-01-05 ENCOUNTER — Telehealth: Payer: Self-pay | Admitting: Family Medicine

## 2023-01-05 NOTE — Telephone Encounter (Signed)
Copied from CRM [id number]. Topic: Medicare AWV >> Jan 05, 2023  9:51 AM Payton Doughty wrote: Reason for CRM: LM 01/05/2023 to schedule AWV   Verlee Rossetti; Care Guide Ambulatory Clinical Support [place] [facility] Direct Dial: [phone]

## 2023-02-24 ENCOUNTER — Telehealth: Payer: Self-pay | Admitting: Family Medicine

## 2023-02-24 NOTE — Telephone Encounter (Signed)
Copied from CRM [id number]. Topic: Medicare AWV >> Feb 24, 2023 11:29 AM Payton Doughty wrote: Reason for CRM: Called LVM 02/24/2023 to schedule Annual Wellness Visit  Verlee Rossetti; Care Guide Ambulatory Clinical Support [place] [facility] Direct Dial: [phone]

## 2023-03-30 ENCOUNTER — Encounter: Payer: Self-pay | Admitting: Gastroenterology

## 2023-04-11 DIAGNOSIS — N951 Menopausal and female climacteric states: Secondary | ICD-10-CM | POA: Diagnosis not present

## 2023-04-11 DIAGNOSIS — N8110 Cystocele, unspecified: Secondary | ICD-10-CM | POA: Diagnosis not present

## 2023-04-11 DIAGNOSIS — Z01419 Encounter for gynecological examination (general) (routine) without abnormal findings: Secondary | ICD-10-CM | POA: Diagnosis not present

## 2023-04-25 ENCOUNTER — Encounter: Payer: Self-pay | Admitting: Gastroenterology

## 2023-04-25 ENCOUNTER — Ambulatory Visit (AMBULATORY_SURGERY_CENTER): Payer: Medicare HMO | Admitting: *Deleted

## 2023-04-25 VITALS — Ht 65.5 in | Wt 134.0 lb

## 2023-04-25 DIAGNOSIS — Z1211 Encounter for screening for malignant neoplasm of colon: Secondary | ICD-10-CM

## 2023-04-25 MED ORDER — NA SULFATE-K SULFATE-MG SULF 17.5-3.13-1.6 GM/177ML PO SOLN: 1.0000 | Freq: Once | ORAL | 0 refills | Status: AC

## 2023-04-25 NOTE — Progress Notes (Signed)
Pre visit completed over telephone.  Instructions forwarded through MyChart.    No egg or soy allergy known to patient  No issues known to pt with past sedation with any surgeries or procedures Patient denies ever being told they had issues or difficulty with intubation  No FH of Malignant Hyperthermia Pt is not on diet pills Pt is not on  home 02  Pt is not on blood thinners  Pt denies issues with constipation  No A fib or A flutter Have any cardiac testing pending-NO Pt instructed to use Singlecare.com or GoodRx for a price reduction on prep   

## 2023-05-16 ENCOUNTER — Encounter: Payer: Self-pay | Admitting: Gastroenterology

## 2023-05-16 ENCOUNTER — Ambulatory Visit: Payer: Medicare HMO | Admitting: Gastroenterology

## 2023-05-16 VITALS — BP 130/65 | HR 62 | Temp 98.4°F | Resp 19 | Ht 64.5 in | Wt 137.0 lb

## 2023-05-16 DIAGNOSIS — D125 Benign neoplasm of sigmoid colon: Secondary | ICD-10-CM

## 2023-05-16 DIAGNOSIS — D122 Benign neoplasm of ascending colon: Secondary | ICD-10-CM

## 2023-05-16 DIAGNOSIS — K648 Other hemorrhoids: Secondary | ICD-10-CM

## 2023-05-16 DIAGNOSIS — K644 Residual hemorrhoidal skin tags: Secondary | ICD-10-CM

## 2023-05-16 DIAGNOSIS — K635 Polyp of colon: Secondary | ICD-10-CM

## 2023-05-16 DIAGNOSIS — D120 Benign neoplasm of cecum: Secondary | ICD-10-CM | POA: Diagnosis not present

## 2023-05-16 DIAGNOSIS — Z860100 Personal history of colon polyps, unspecified: Secondary | ICD-10-CM

## 2023-05-16 DIAGNOSIS — Z1211 Encounter for screening for malignant neoplasm of colon: Secondary | ICD-10-CM

## 2023-05-16 MED ORDER — SODIUM CHLORIDE 0.9 % IV SOLN: 500.0000 mL | Freq: Once | INTRAVENOUS | Status: DC

## 2023-05-16 NOTE — Progress Notes (Signed)
Sedate, gd SR, tolerated procedure well, VSS, report to RN 

## 2023-05-16 NOTE — Op Note (Signed)
[facility] Patient Name: Kayla Ortega Procedure Date: 05/16/2023 11:40 AM MRN: [id number] Endoscopist: Napoleon Form , MD, [id number] Age: 67 Referring MD:  Date of Birth: Dec 21, 1955 Gender: Female Account #: [id number] Procedure:                Colonoscopy Indications:              High risk colon cancer surveillance: Personal                            history of colonic polyps Medicines:                Monitored Anesthesia Care Procedure:                Pre-Anesthesia Assessment:                           - Prior to the procedure, a History and Physical                            was performed, and patient medications and                            allergies were reviewed. The patient's tolerance of                            previous anesthesia was also reviewed. The risks                            and benefits of the procedure and the sedation                            options and risks were discussed with the patient.                            All questions were answered, and informed consent                            was obtained. Prior Anticoagulants: The patient has                            taken no anticoagulant or antiplatelet agents. ASA                            Grade Assessment: II - A patient with mild systemic                            disease. After reviewing the risks and benefits,                            the patient was deemed in satisfactory condition to                            undergo the procedure.  After obtaining informed consent, the colonoscope                            was passed under direct vision. Throughout the                            procedure, the patient's blood pressure, pulse, and                            oxygen saturations were monitored continuously. The                            Olympus Scope [id number] was introduced through the                            anus and advanced to the  the cecum, identified by                            appendiceal orifice and ileocecal valve. The                            colonoscopy was performed without difficulty. The                            patient tolerated the procedure well. The quality                            of the bowel preparation was good. The ileocecal                            valve, appendiceal orifice, and rectum were                            photographed. Scope In: 11:47:26 AM Scope Out: 12:06:03 PM Scope Withdrawal Time: 0 hours 12 minutes 35 seconds  Total Procedure Duration: 0 hours 18 minutes 37 seconds  Findings:                 The perianal and digital rectal examinations were                            normal.                           Seven sessile polyps were found in the sigmoid                            colon, transverse colon, ascending colon and cecum.                            The polyps were 5 to 12 mm in size. These polyps                            were removed with a cold snare. Resection and  retrieval were complete.                           Non-bleeding external and internal hemorrhoids were                            found during retroflexion. The hemorrhoids were                            medium-sized. Complications:            No immediate complications. Estimated Blood Loss:     Estimated blood loss was minimal. Impression:               - Seven 5 to 12 mm polyps in the sigmoid colon, in                            the transverse colon, in the ascending colon and in                            the cecum, removed with a cold snare. Resected and                            retrieved.                           - Non-bleeding external and internal hemorrhoids. Recommendation:           - Patient has a contact number available for                            emergencies. The signs and symptoms of potential                            delayed complications were  discussed with the                            patient. Return to normal activities tomorrow.                            Written discharge instructions were provided to the                            patient.                           - Resume previous diet.                           - Continue present medications.                           - Await pathology results.                           - Repeat colonoscopy in 3 years for surveillance  based on pathology results. Napoleon Form, MD 05/16/2023 12:12:30 PM This report has been signed electronically.

## 2023-05-16 NOTE — Progress Notes (Signed)
Called to room to assist during endoscopic procedure.  Patient ID and intended procedure confirmed with present staff. Received instructions for my participation in the procedure from the performing physician.  

## 2023-05-16 NOTE — Patient Instructions (Signed)
Resume previous diet and medications. Awaiting pathology results. Likely repeat Colonoscopy in 3 years for surveillance purposes. Handouts provided on colon polyps and Hemorrhoids  YOU HAD AN ENDOSCOPIC PROCEDURE TODAY AT THE [facility]:   Refer to the procedure report that was given to you for any specific questions about what was found during the examination.  If the procedure report does not answer your questions, please call your gastroenterologist to clarify.  If you requested that your care partner not be given the details of your procedure findings, then the procedure report has been included in a sealed envelope for you to review at your convenience later.  YOU SHOULD EXPECT: Some feelings of bloating in the abdomen. Passage of more gas than usual.  Walking can help get rid of the air that was put into your GI tract during the procedure and reduce the bloating. If you had a lower endoscopy (such as a colonoscopy or flexible sigmoidoscopy) you may notice spotting of blood in your stool or on the toilet paper. If you underwent a bowel prep for your procedure, you may not have a normal bowel movement for a few days.  Please Note:  You might notice some irritation and congestion in your nose or some drainage.  This is from the oxygen used during your procedure.  There is no need for concern and it should clear up in a day or so.  SYMPTOMS TO REPORT IMMEDIATELY:  Following lower endoscopy (colonoscopy or flexible sigmoidoscopy):  Excessive amounts of blood in the stool  Significant tenderness or worsening of abdominal pains  Swelling of the abdomen that is new, acute  Fever of 100F or higher  For urgent or emergent issues, a gastroenterologist can be reached at any hour by calling [phone]. Do not use MyChart messaging for urgent concerns.    DIET:  We do recommend a small meal at first, but then you may proceed to your regular diet.  Drink plenty of fluids but you should  avoid alcoholic beverages for 24 hours.  ACTIVITY:  You should plan to take it easy for the rest of today and you should NOT DRIVE or use heavy machinery until tomorrow (because of the sedation medicines used during the test).    FOLLOW UP: Our staff will call the number listed on your records the next business day following your procedure.  We will call around 7:15- 8:00 am to check on you and address any questions or concerns that you may have regarding the information given to you following your procedure. If we do not reach you, we will leave a message.     If any biopsies were taken you will be contacted by phone or by letter within the next 1-3 weeks.  Please call us at [phone] if you have not heard about the biopsies in 3 weeks.    SIGNATURES/CONFIDENTIALITY: You and/or your care partner have signed paperwork which will be entered into your electronic medical record.  These signatures attest to the fact that that the information above on your After Visit Summary has been reviewed and is understood.  Full responsibility of the confidentiality of this discharge information lies with you and/or your care-partner.

## 2023-05-16 NOTE — Progress Notes (Signed)
[facility] History and Physical   Primary Care Physician:  Dana Allan, MD   Reason for Procedure:  History of adenomatous colon polyps  Plan:    Surveillance colonoscopy with possible interventions as needed     HPI: Kayla Ortega is a very pleasant 67 y.o. female here for surveillance colonoscopy. Denies any nausea, vomiting, abdominal pain, melena or bright red blood per rectum  The risks and benefits as well as alternatives of endoscopic procedure(s) have been discussed and reviewed. All questions answered. The patient agrees to proceed.    Past Medical History:  Diagnosis Date   Breast cancer [facility]) 1997   right breast lumpectomy   History of breast cancer 1997   left   Personal history of chemotherapy    Personal history of radiation therapy     Past Surgical History:  Procedure Laterality Date   BREAST EXCISIONAL BIOPSY     BREAST LUMPECTOMY  1997   right   CESAREAN SECTION  1988   COLONOSCOPY      Prior to Admission medications   Medication Sig Start Date End Date Taking? Authorizing Provider  Ascorbic Acid (VITAMIN C WITH ROSE HIPS) 1000 MG tablet Take 1,000 mg by mouth daily.   Yes [provider]  Cholecalciferol (D3 5000) 125 MCG (5000 UT) capsule Take 5,000 Units by mouth daily.   Yes [provider]  Multiple Vitamin (MULTIVITAMIN) tablet Take 1 tablet by mouth daily.   Yes [provider]  polyethylene glycol powder (GLYCOLAX/MIRALAX) 17 GM/SCOOP powder Take 17 g by mouth daily as needed. 12/08/22   Dana Allan, MD    Current Outpatient Medications  Medication Sig Dispense Refill   Ascorbic Acid (VITAMIN C WITH ROSE HIPS) 1000 MG tablet Take 1,000 mg by mouth daily.     Cholecalciferol (D3 5000) 125 MCG (5000 UT) capsule Take 5,000 Units by mouth daily.     Multiple Vitamin (MULTIVITAMIN) tablet Take 1 tablet by mouth daily.     polyethylene glycol powder (GLYCOLAX/MIRALAX) 17 GM/SCOOP powder Take  17 g by mouth daily as needed. 3350 g 1   Current Facility-Administered Medications  Medication Dose Route Frequency Provider Last Rate Last Admin   0.9 %  sodium chloride infusion  500 mL Intravenous Once Napoleon Form, MD        Allergies as of 05/16/2023   (No Known Allergies)    Family History  Problem Relation Age of Onset   Alzheimer's disease Father    Colon cancer Neg Hx    Cancer Neg Hx    Heart disease Neg Hx    Stroke Neg Hx    Cancer - Colon Neg Hx    Stomach cancer Neg Hx    Rectal cancer Neg Hx     Social History   Socioeconomic History   Marital status: Single    Spouse name: Not on file   Number of children: Not on file   Years of education: Not on file   Highest education level: Not on file  Occupational History   Not on file  Tobacco Use   Smoking status: Never   Smokeless tobacco: Never  Vaping Use   Vaping status: Never Used  Substance and Sexual Activity   Alcohol use: Yes    Alcohol/week: 7.0 standard drinks of alcohol    Types: 7 Glasses of wine per week   Drug use: No   Sexual activity: Yes    Birth control/protection: Post-menopausal  Other Topics Concern  Not on file  Social History Narrative   Not on file   Social Drivers of Health   Financial Resource Strain: Not on file  Food Insecurity: Not on file  Transportation Needs: Not on file  Physical Activity: Not on file  Stress: Not on file  Social Connections: Not on file  Intimate Partner Violence: Not on file    Review of Systems:  All other review of systems negative except as mentioned in the HPI.  Physical Exam: Vital signs in last 24 hours: BP 118/68   Pulse 83   Temp 98.4 F (36.9 C) (Skin)   Ht 5' 4.5" (1.638 m)   Wt 137 lb (62.1 kg)   SpO2 100%   BMI 23.15 kg/m  General:   Alert, NAD Lungs:  Clear .   Heart:  Regular rate and rhythm Abdomen:  Soft, nontender and nondistended. Neuro/Psych:  Alert and cooperative. Normal mood and affect. A and O x  3  Reviewed labs, radiology imaging, old records and pertinent past GI work up  Patient is appropriate for planned procedure(s) and anesthesia in an ambulatory setting   K. Scherry Ran , MD [phone]

## 2023-05-16 NOTE — Progress Notes (Signed)
Pt's states no medical or surgical changes since previsit or office visit. 

## 2023-05-17 ENCOUNTER — Telehealth: Payer: Self-pay

## 2023-05-17 NOTE — Telephone Encounter (Signed)
 Follow up call to pt, lm for pt to call if having any difficulty with normal activities or eating and drinking.  Also to call if any other questions or concerns.

## 2023-05-20 LAB — SURGICAL PATHOLOGY

## 2023-05-23 ENCOUNTER — Encounter: Payer: Medicare HMO | Admitting: Family Medicine

## 2023-06-14 ENCOUNTER — Encounter: Payer: Self-pay | Admitting: Gastroenterology

## 2023-09-13 ENCOUNTER — Encounter: Payer: Self-pay | Admitting: Family Medicine

## 2023-09-13 ENCOUNTER — Telehealth: Payer: Self-pay | Admitting: Family Medicine

## 2023-09-13 NOTE — Telephone Encounter (Signed)
 Left message that June 30th appointment canceled, provider out. MyChart letter sent.

## 2023-09-29 IMAGING — MG MM DIGITAL SCREENING BILAT W/ TOMO AND CAD
8 series · 9 of 24 positions shown · non-contrast
Comparison: Previous exam(s).

CLINICAL DATA: Screening.

EXAM:
DIGITAL SCREENING BILATERAL MAMMOGRAM WITH TOMOSYNTHESIS AND CAD
TECHNIQUE: Bilateral screening digital craniocaudal and mediolateral oblique
mammograms were obtained. Bilateral screening digital breast
tomosynthesis was performed. The images were evaluated with
computer-aided detection.

[L MLO synth-2D]
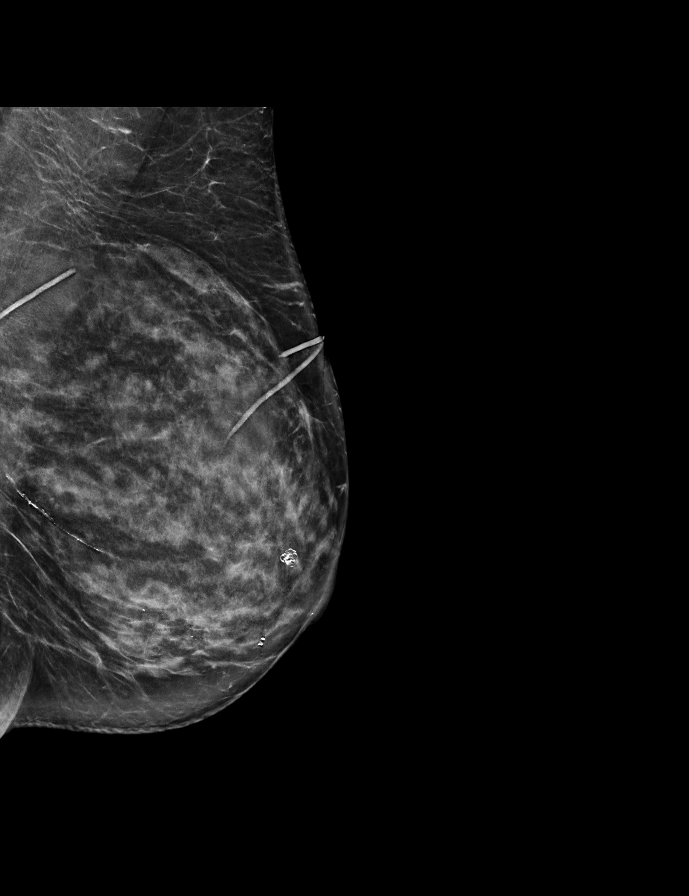

[R CC synth-2D]
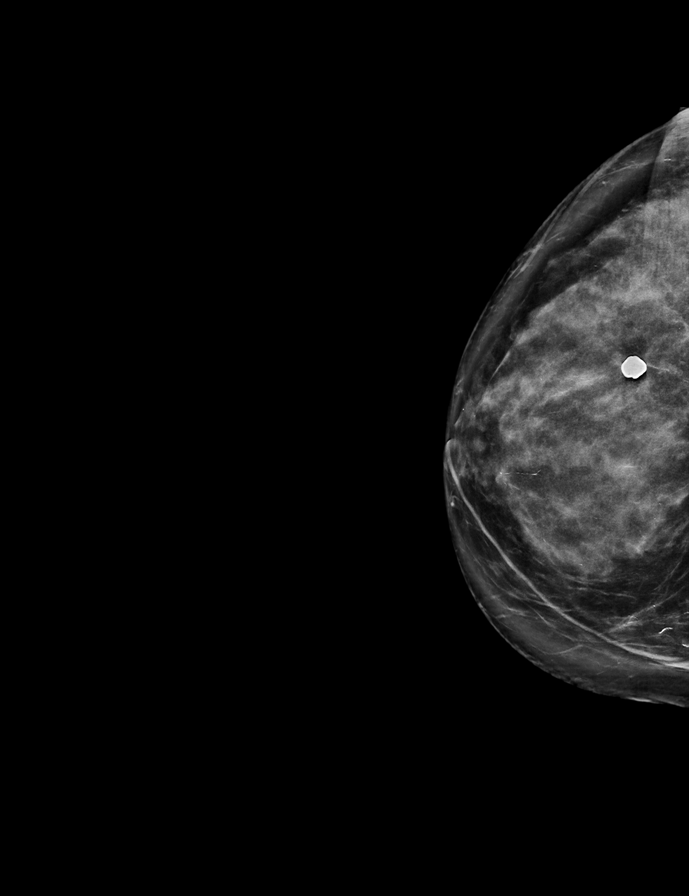

[L CC synth-2D]
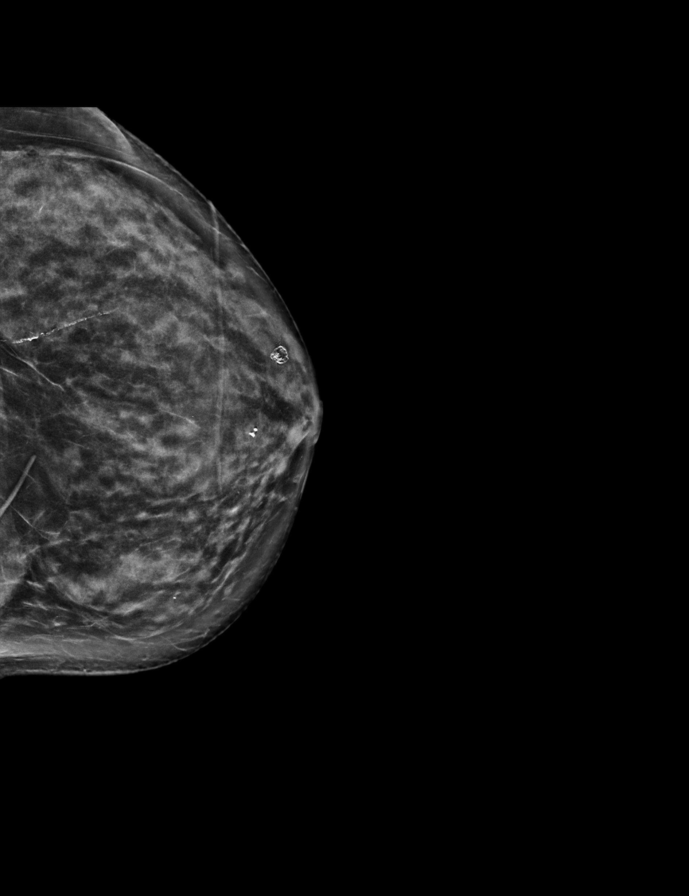

[R MLO synth-2D]
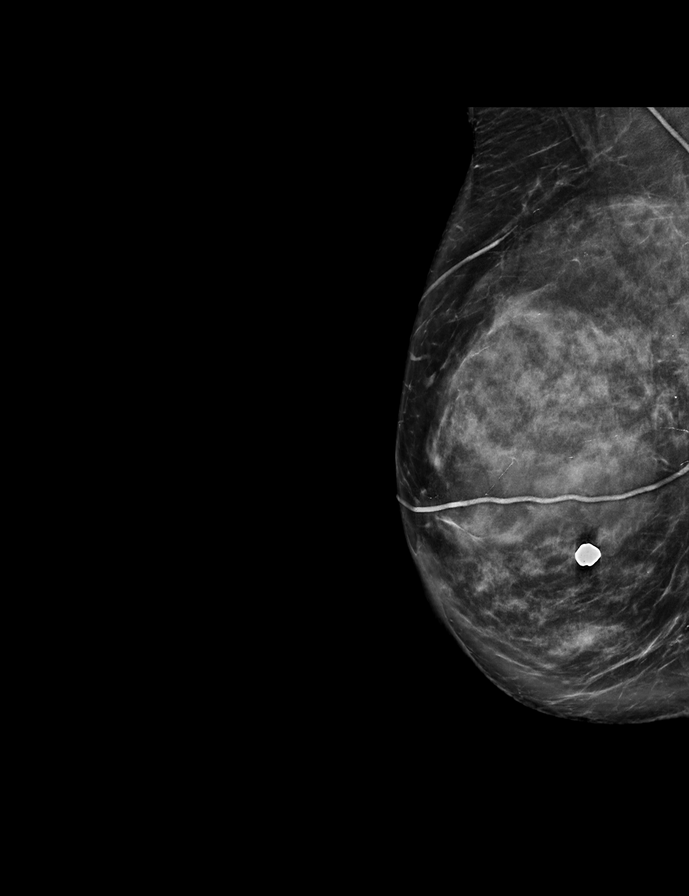

[R MLO tomo · 2 of 72 frames shown]
[frame 24/72]
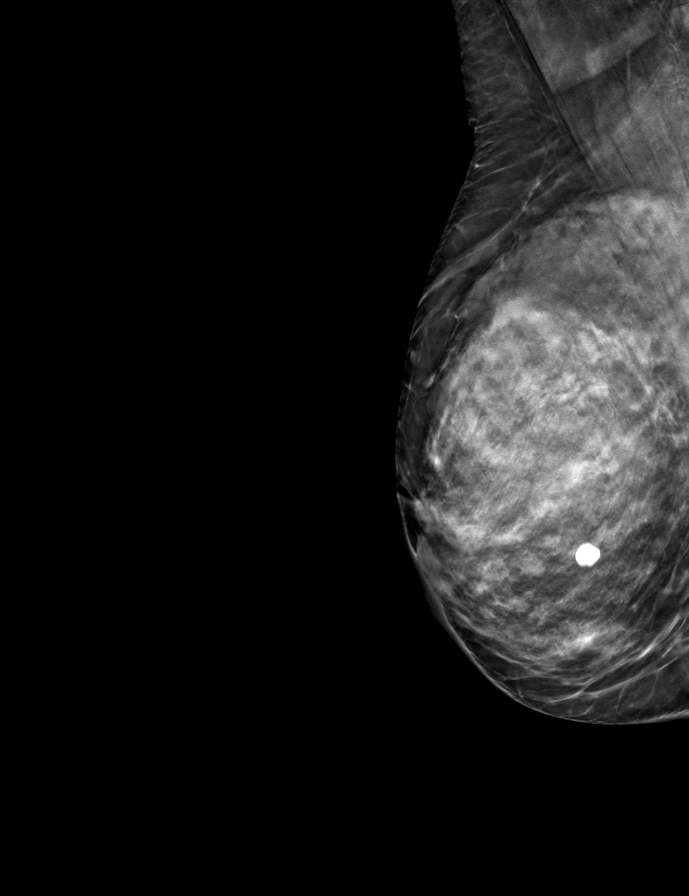
[frame 37/72]
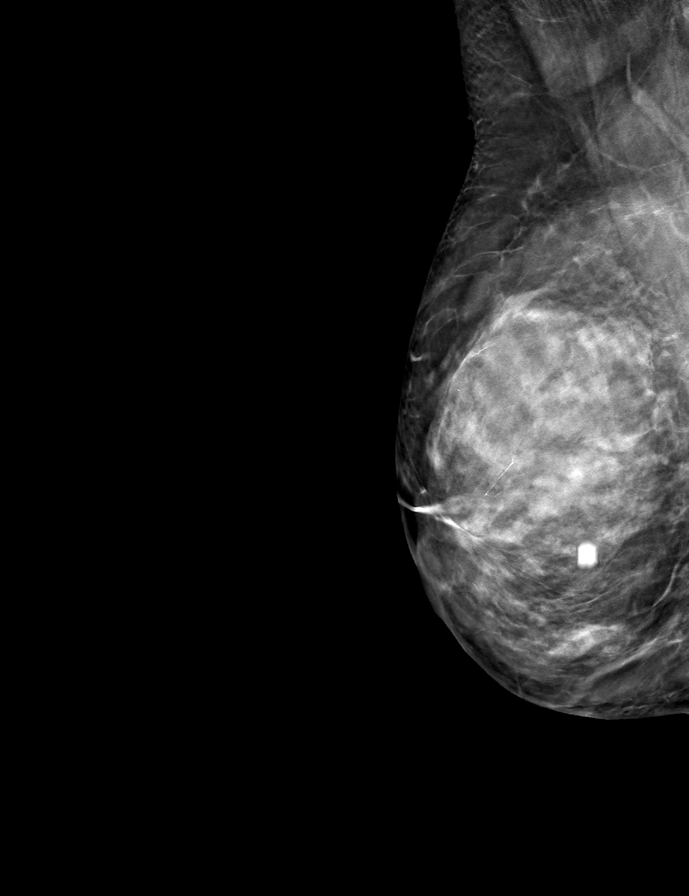

[L MLO tomo · tomo slice 36/71.0]
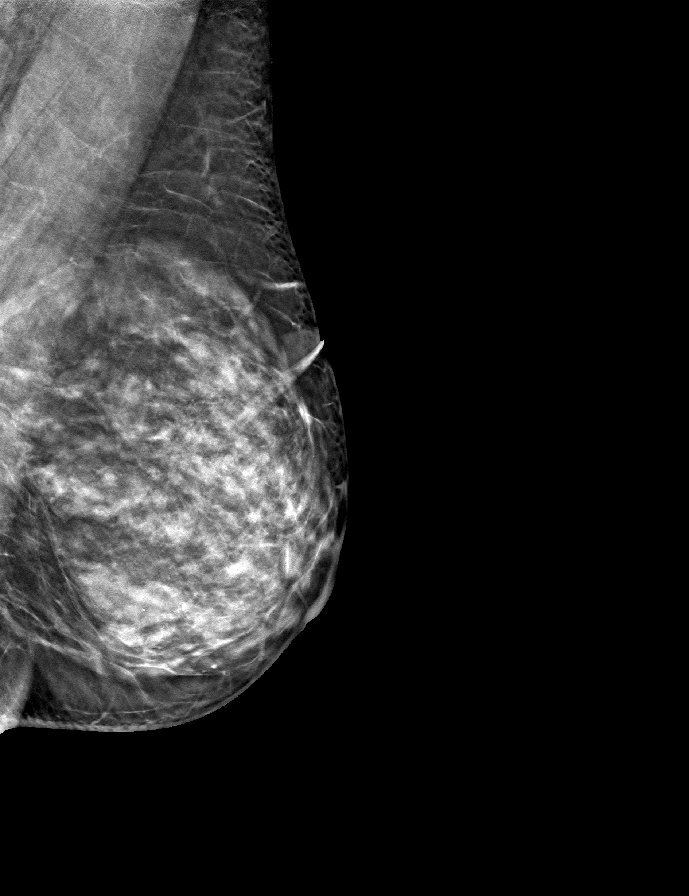

[R CC tomo · tomo slice 37/74.0]
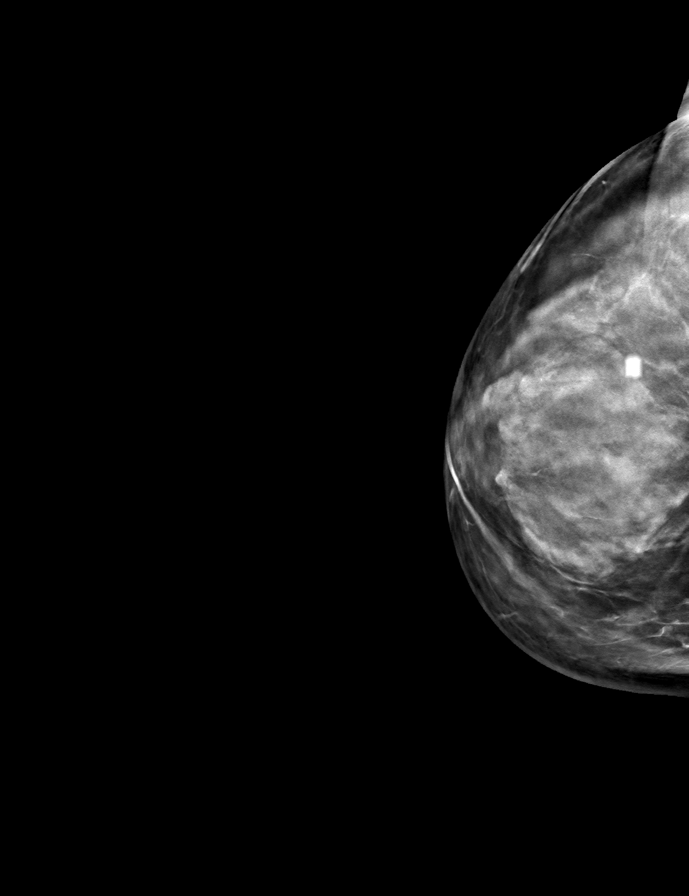

[L CC tomo · tomo slice 36/71.0]
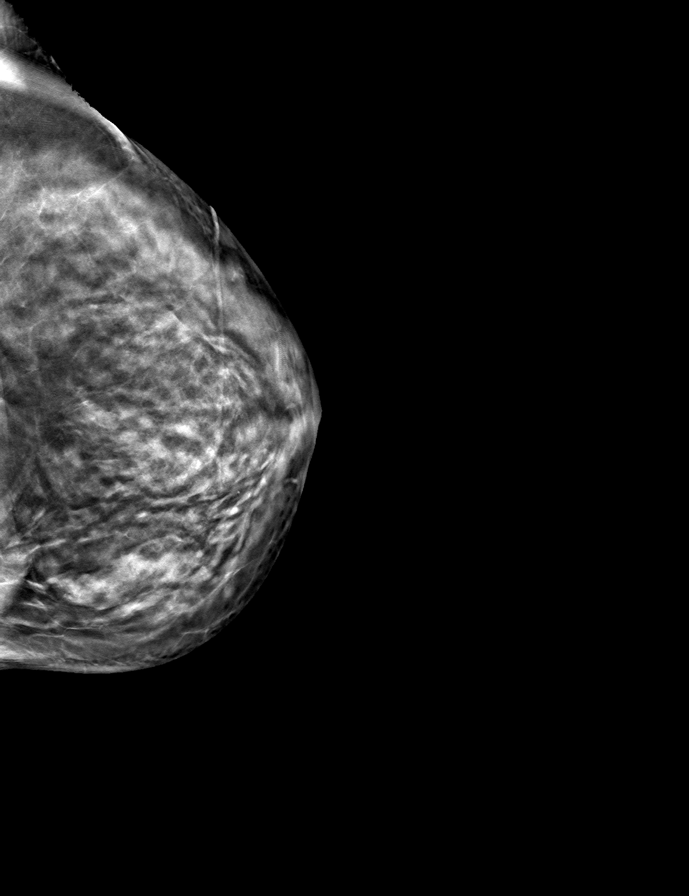

[9 of 24 positions shown; findings below may reference images not displayed]

ACR Breast Density Category d: The breast tissue is extremely dense,
which lowers the sensitivity of mammography
FINDINGS: There are no findings suspicious for malignancy.
IMPRESSION: No mammographic evidence of malignancy. A result letter of this
screening mammogram will be mailed directly to the patient.

RECOMMENDATION:
Screening mammogram in one year. (Code:TA-V-WV9)

BI-RADS CATEGORY  1: Negative.

## 2023-11-14 ENCOUNTER — Encounter: Payer: Medicare HMO | Admitting: Family Medicine

## 2023-12-20 DIAGNOSIS — Z1331 Encounter for screening for depression: Secondary | ICD-10-CM | POA: Diagnosis not present

## 2023-12-20 DIAGNOSIS — Z853 Personal history of malignant neoplasm of breast: Secondary | ICD-10-CM | POA: Diagnosis not present

## 2023-12-20 DIAGNOSIS — Z1231 Encounter for screening mammogram for malignant neoplasm of breast: Secondary | ICD-10-CM | POA: Diagnosis not present

## 2023-12-20 DIAGNOSIS — M8580 Other specified disorders of bone density and structure, unspecified site: Secondary | ICD-10-CM | POA: Diagnosis not present

## 2023-12-20 DIAGNOSIS — R141 Gas pain: Secondary | ICD-10-CM | POA: Diagnosis not present

## 2023-12-20 DIAGNOSIS — Z860100 Personal history of colon polyps, unspecified: Secondary | ICD-10-CM | POA: Diagnosis not present

## 2023-12-20 DIAGNOSIS — Z87898 Personal history of other specified conditions: Secondary | ICD-10-CM | POA: Diagnosis not present

## 2023-12-20 DIAGNOSIS — E559 Vitamin D deficiency, unspecified: Secondary | ICD-10-CM | POA: Diagnosis not present

## 2023-12-20 DIAGNOSIS — Z780 Asymptomatic menopausal state: Secondary | ICD-10-CM | POA: Diagnosis not present

## 2024-03-29 ENCOUNTER — Other Ambulatory Visit: Payer: Self-pay | Admitting: Obstetrics and Gynecology

## 2024-03-29 DIAGNOSIS — Z1231 Encounter for screening mammogram for malignant neoplasm of breast: Secondary | ICD-10-CM

## 2024-04-04 NOTE — Telephone Encounter (Signed)
 open in error

## 2024-04-23 ENCOUNTER — Ambulatory Visit

## 2024-04-23 ENCOUNTER — Ambulatory Visit
Admission: RE | Admit: 2024-04-23 | Discharge: 2024-04-23 | Disposition: A | Source: Ambulatory Visit | Attending: Obstetrics and Gynecology | Admitting: Obstetrics and Gynecology

## 2024-04-23 DIAGNOSIS — Z1231 Encounter for screening mammogram for malignant neoplasm of breast: Secondary | ICD-10-CM
